# Patient Record
Sex: Male | Born: 1970 | Race: Black or African American | Hispanic: No | Marital: Single | Smoking: Never smoker
Health system: Southern US, Community
[De-identification: ages and names within clinical notes are randomized; demographics above are authoritative.]

---

## 2017-02-06 ENCOUNTER — Emergency Department (HOSPITAL_COMMUNITY)
Admission: EM | Admit: 2017-02-06 | Discharge: 2017-02-06 | Disposition: A | Discharge status: Home / Self Care | Payer: Self-pay | Attending: Emergency Medicine | Admitting: Emergency Medicine

## 2017-02-06 ENCOUNTER — Encounter (HOSPITAL_COMMUNITY): Payer: Self-pay | Admitting: Emergency Medicine

## 2017-02-06 ENCOUNTER — Emergency Department (HOSPITAL_COMMUNITY): Payer: Self-pay

## 2017-02-06 DIAGNOSIS — Y939 Activity, unspecified: Secondary | ICD-10-CM | POA: Insufficient documentation

## 2017-02-06 DIAGNOSIS — S6991XA Unspecified injury of right wrist, hand and finger(s), initial encounter: Secondary | ICD-10-CM

## 2017-02-06 DIAGNOSIS — S60311A Abrasion of right thumb, initial encounter: Secondary | ICD-10-CM | POA: Insufficient documentation

## 2017-02-06 DIAGNOSIS — S60221A Contusion of right hand, initial encounter: Secondary | ICD-10-CM | POA: Insufficient documentation

## 2017-02-06 DIAGNOSIS — Y999 Unspecified external cause status: Secondary | ICD-10-CM | POA: Insufficient documentation

## 2017-02-06 DIAGNOSIS — Y929 Unspecified place or not applicable: Secondary | ICD-10-CM | POA: Insufficient documentation

## 2017-02-06 DIAGNOSIS — W208XXA Other cause of strike by thrown, projected or falling object, initial encounter: Secondary | ICD-10-CM | POA: Insufficient documentation

## 2017-02-06 MED ORDER — IBUPROFEN 600 MG PO TABS: 600.0000 mg | ORAL_TABLET | Freq: Four times a day (QID) | ORAL | 0 refills | Status: AC | PRN

## 2017-02-06 MED ORDER — BACITRACIN ZINC 500 UNIT/GM EX OINT
1.0000 "application " | TOPICAL_OINTMENT | Freq: Two times a day (BID) | CUTANEOUS | Status: DC
  Administered 2017-02-06: 1 via TOPICAL
  Filled 2017-02-06: qty 0.9

## 2017-02-06 MED ORDER — BACITRACIN ZINC 500 UNIT/GM EX OINT: 1.0000 "application " | TOPICAL_OINTMENT | Freq: Two times a day (BID) | CUTANEOUS | 0 refills | Status: AC

## 2017-02-06 MED ORDER — IBUPROFEN 800 MG PO TABS
800.0000 mg | ORAL_TABLET | Freq: Once | ORAL | Status: AC
  Administered 2017-02-06: 800 mg via ORAL
  Filled 2017-02-06: qty 1

## 2017-02-06 NOTE — Progress Notes (Signed)
Orthopedic Tech Progress Note Patient Details:  Bob Howard 20-Jan-1971 [id number]  Ortho Devices Type of Ortho Device: Thumb velcro splint Ortho Device/Splint Interventions: Application   Saul Fordyce 02/06/2017, 6:35 PM

## 2017-02-06 NOTE — Discharge Instructions (Signed)
Topical antibiotics twice daily Change dressing twice daily Use splint to protect finger for one week See your doctor for wound check in 1 week

## 2017-02-06 NOTE — ED Notes (Signed)
Patient requesting pain medication.  States "the purpose of coming here today was to get something for pain".  Will notify RN.
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
## 2018-10-25 IMAGING — CR DG HAND COMPLETE 3+V*R*
3 series · 3 of 3 positions shown · non-contrast
Comparison: None.

CLINICAL DATA: Injury to the right hand

EXAM:
RIGHT HAND - COMPLETE 3+ VIEW

[hand obl]
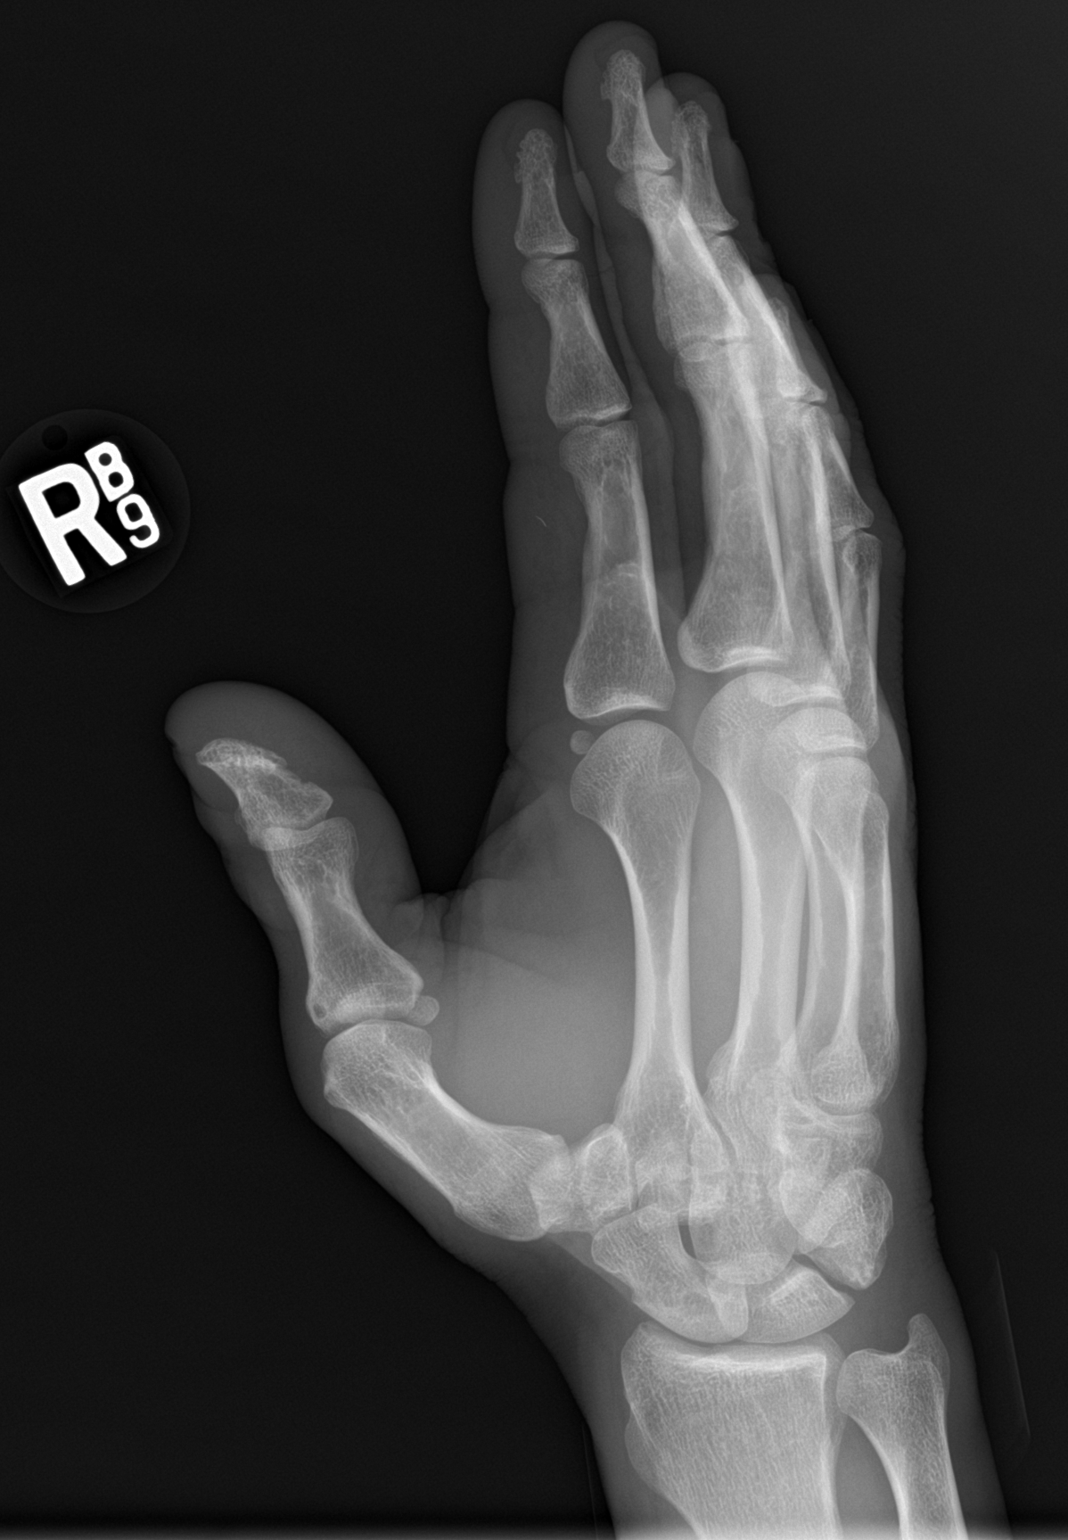

[hand lat]
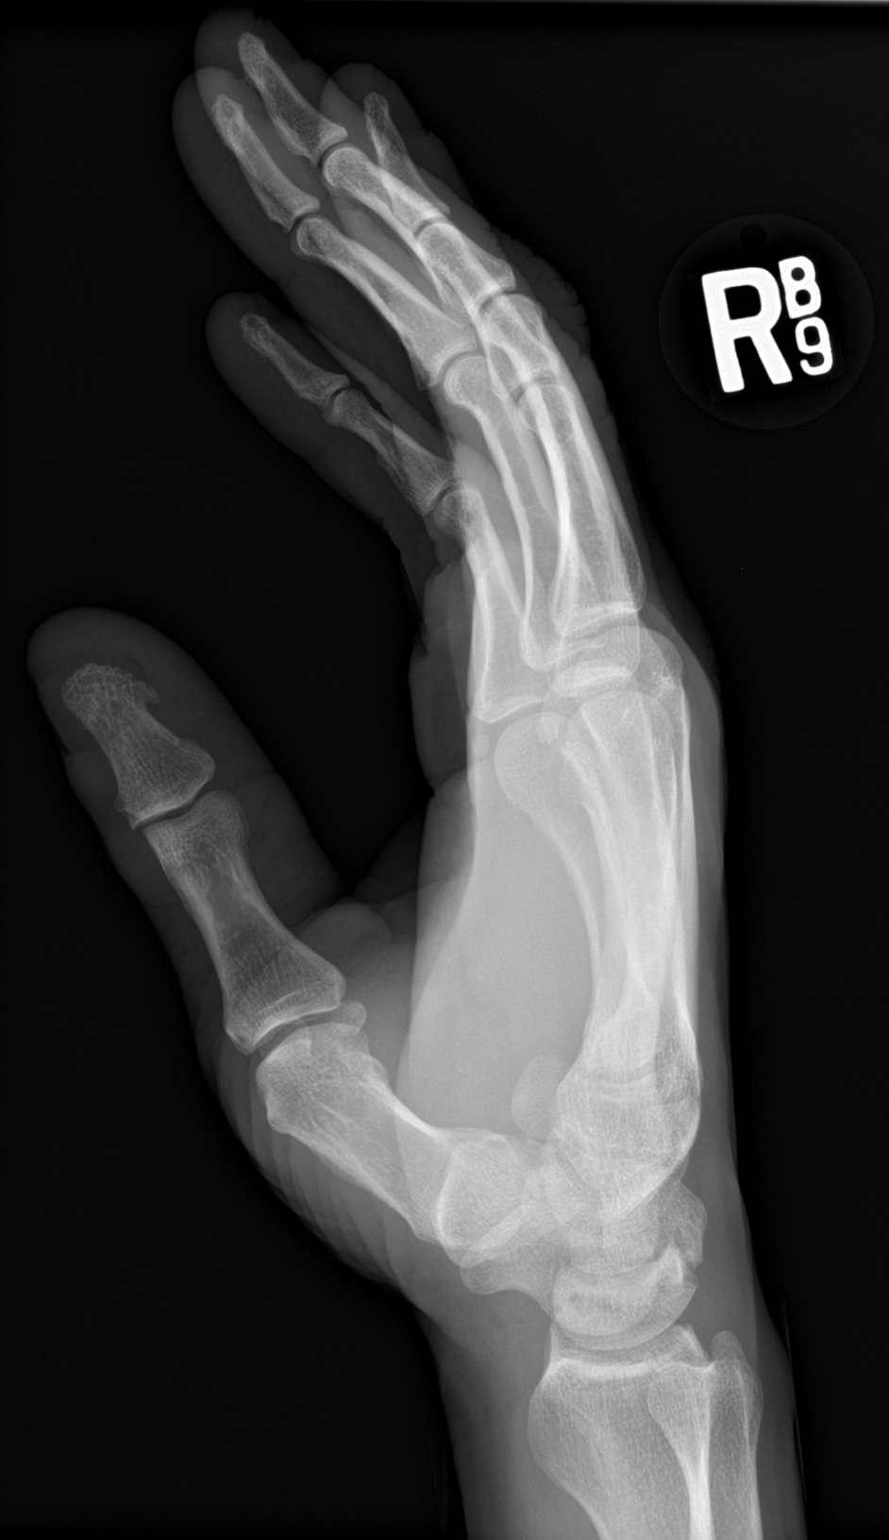

[hand pa]
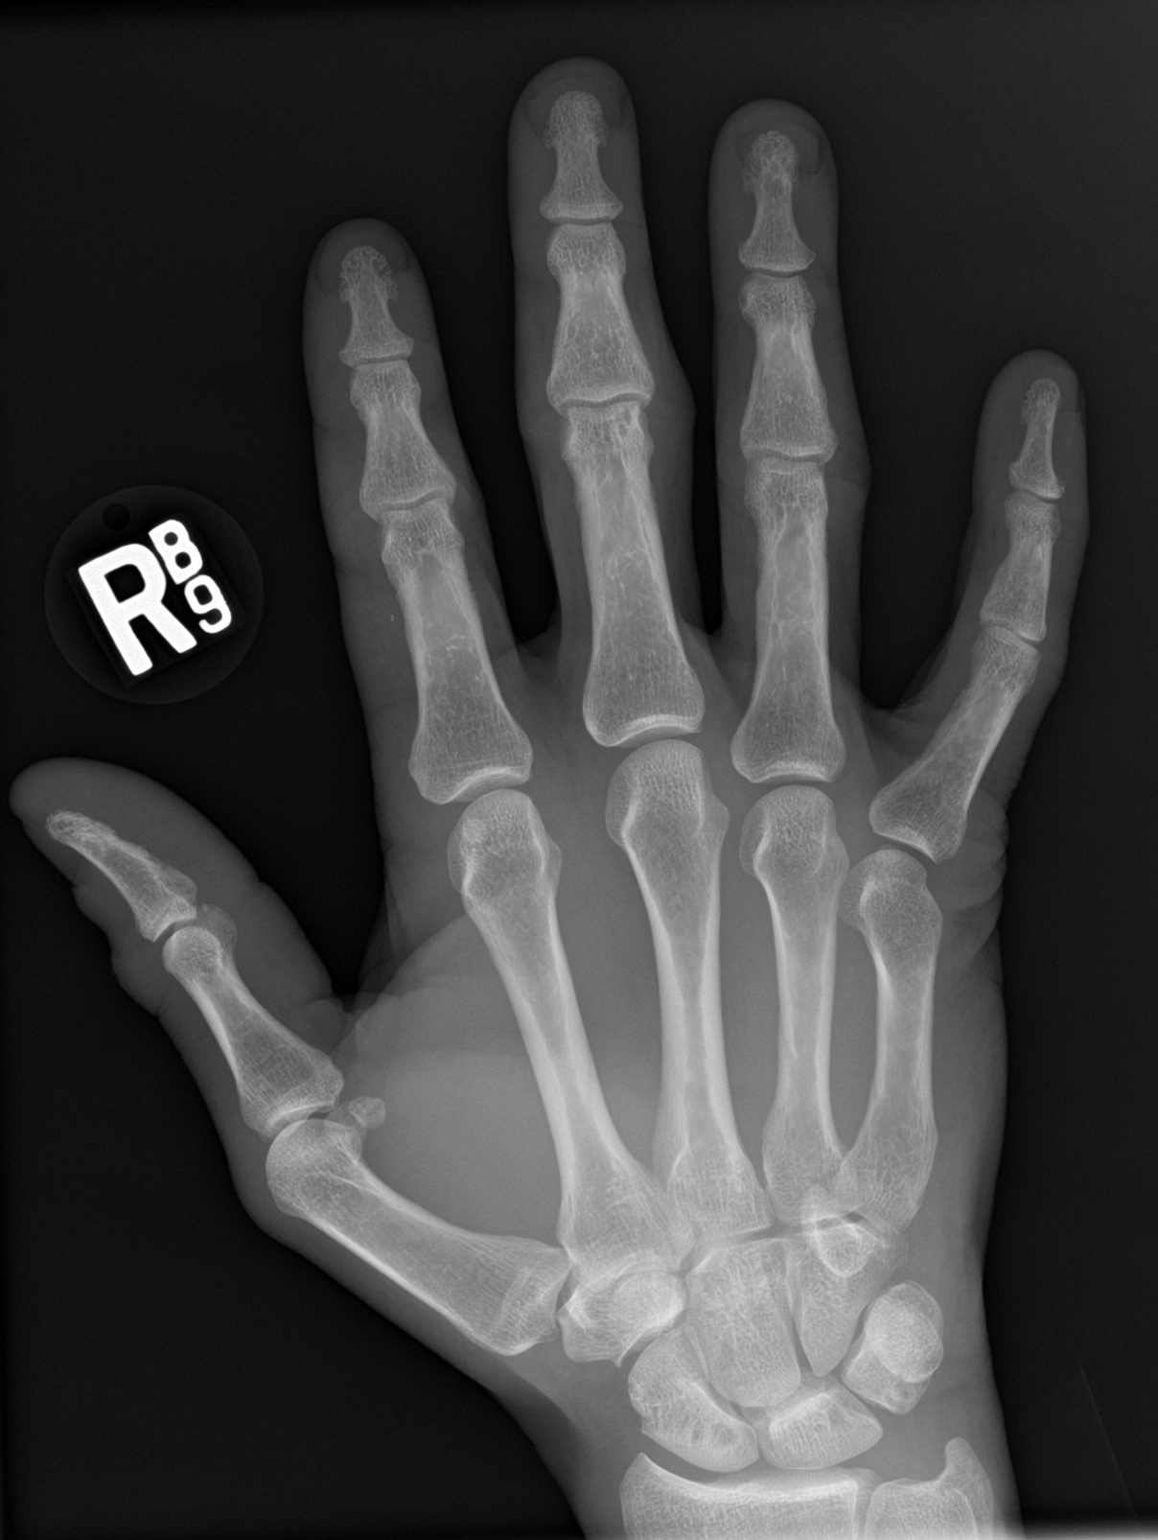

[3 of 3 positions shown; findings below may reference images not displayed]

FINDINGS: No fracture or malalignment. Possible tiny radiopaque foreign body
adjacent to the second proximal phalanx. Small cysts in the lunate
and scaphoid
IMPRESSION: 1. No acute osseous abnormality
2. Possible small foreign body in the soft tissues adjacent to the
mid to distal second proximal phalanx
